# Patient Record
Sex: Female | Born: 1995 | Race: White | Hispanic: No | Marital: Single | Smoking: Never smoker
Health system: Southern US, Community
[De-identification: ages and names within clinical notes are randomized; demographics above are authoritative.]

## PROBLEM LIST (undated history)

## (undated) DIAGNOSIS — E119 Type 2 diabetes mellitus without complications: Secondary | ICD-10-CM

---
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
## 2018-04-27 IMAGING — CT CT ABD-PELV W/ CM
2 of 4 series · 16 of 46 positions shown, 18 images · IV contrast (APPLIED)
Comparison: None.

CLINICAL DATA: 20 y/o F; 2 weeks of lower abdominal pain right
greater than left with nausea and diarrhea. Leukocytosis.

EXAM:
CT ABDOMEN AND PELVIS WITH CONTRAST
TECHNIQUE: Multidetector CT imaging of the abdomen and pelvis was performed
using the standard protocol following bolus administration of
intravenous contrast.
CONTRAST:  100mL NHMH6I-SWW IOPAMIDOL (NHMH6I-SWW) INJECTION 61%

[Series 2: axial st · axial · 0.71mm/px · z∈[-463,-13]mm · 13 of 98 slices shown, 15 images]
[im 4/98  soft-tissue]
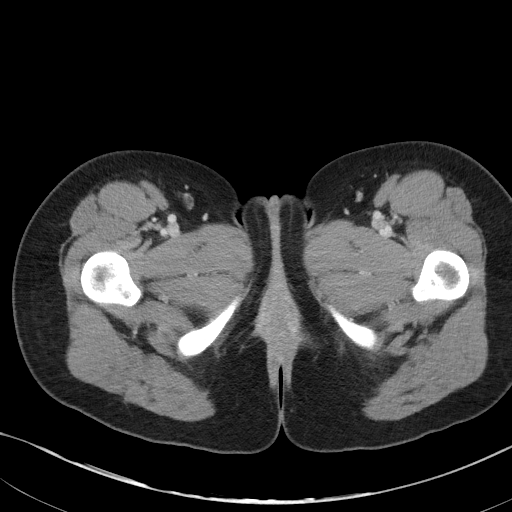
[im 4/98  bone]
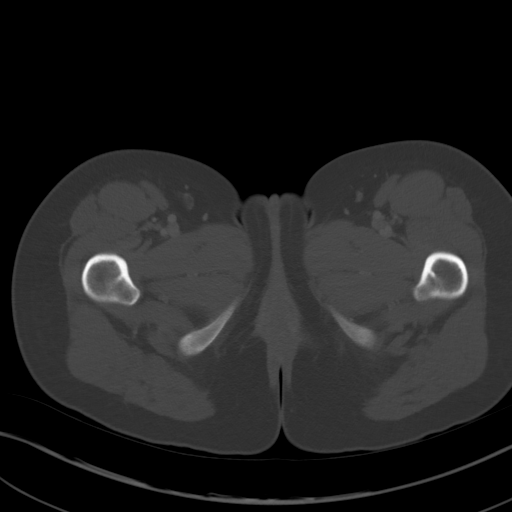
[im 12/98  soft-tissue]
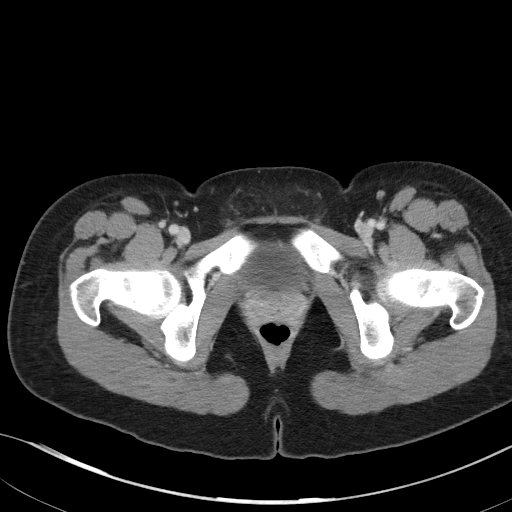
[im 20/98  soft-tissue]
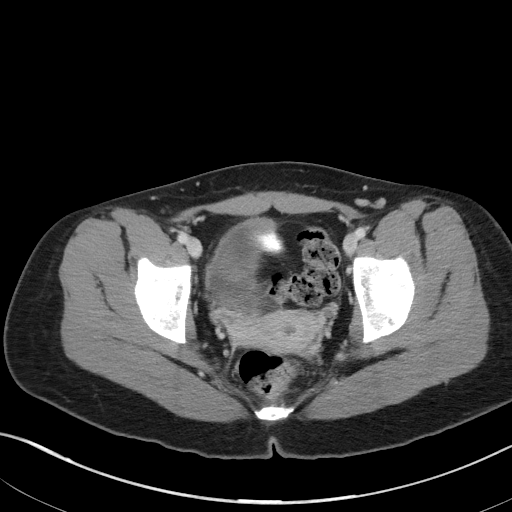
[im 28/98  soft-tissue]
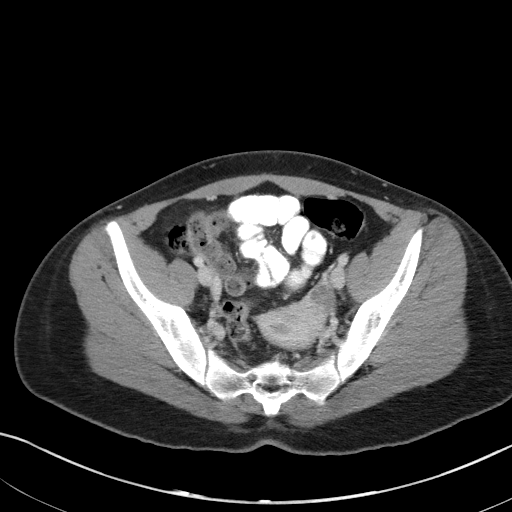
[im 35/98  soft-tissue]
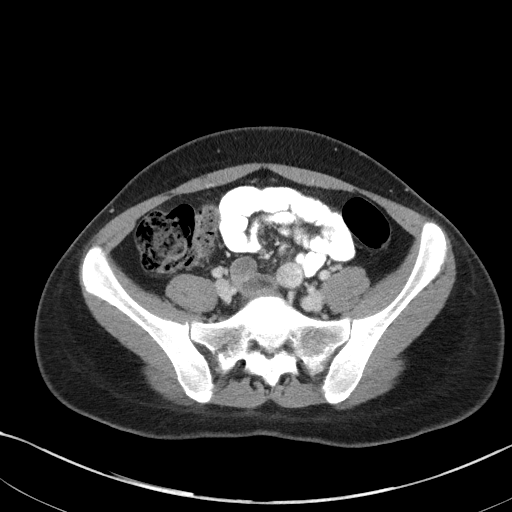
[im 43/98  soft-tissue]
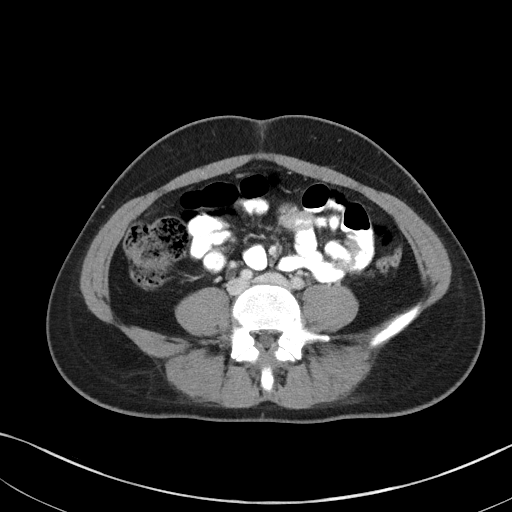
[im 51/98  soft-tissue]
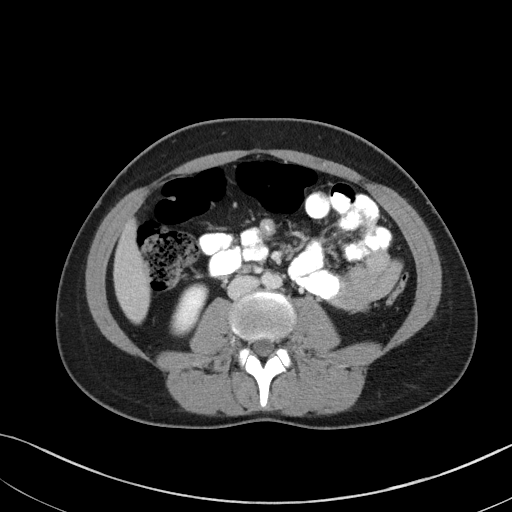
[im 55/98  soft-tissue]
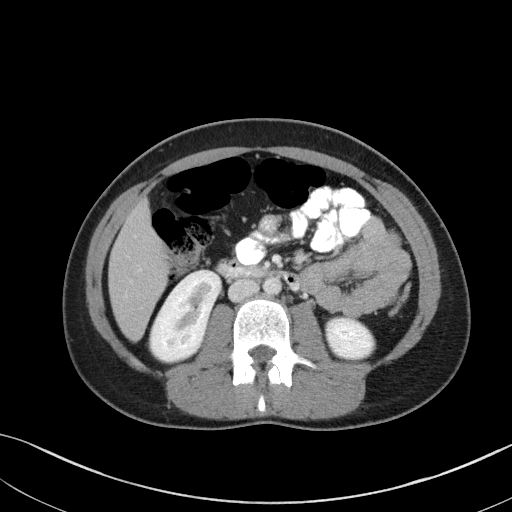
[im 63/98  soft-tissue]
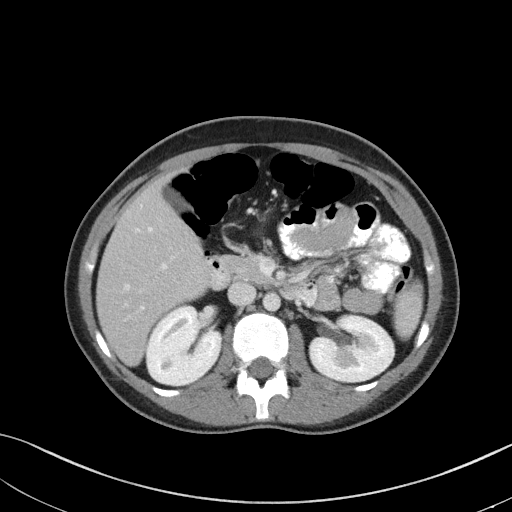
[im 63/98  bone]
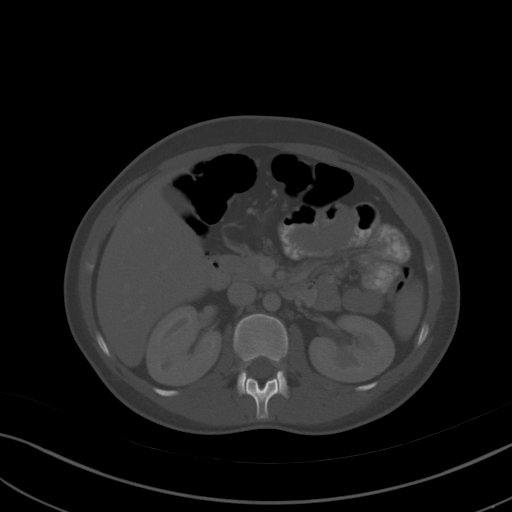
[im 70/98  soft-tissue]
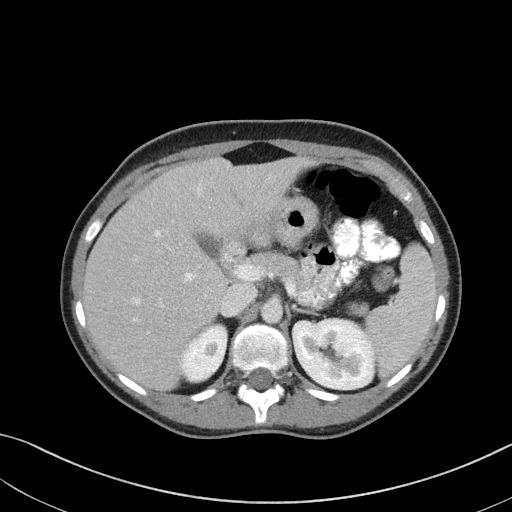
[im 78/98  soft-tissue]
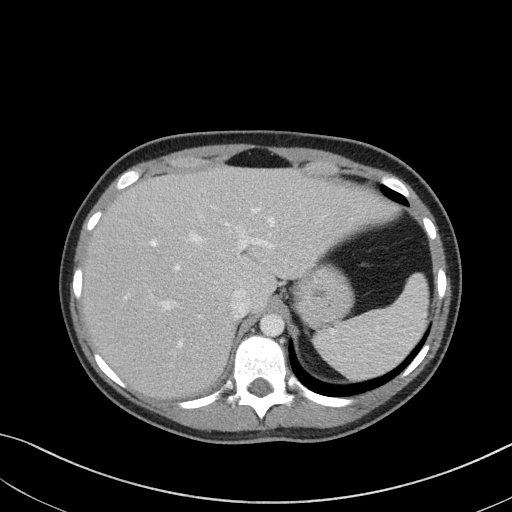
[im 86/98  soft-tissue]
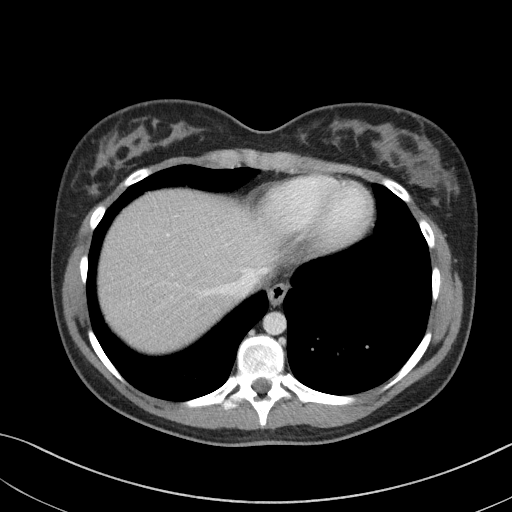
[im 94/98  soft-tissue]
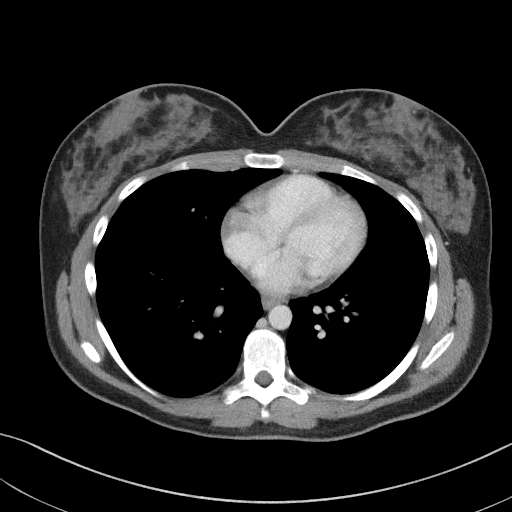

[Series 5: coronal st · coronal · 0.68mm/px · 3 of 70 slices shown]
[im 24/70  soft-tissue]
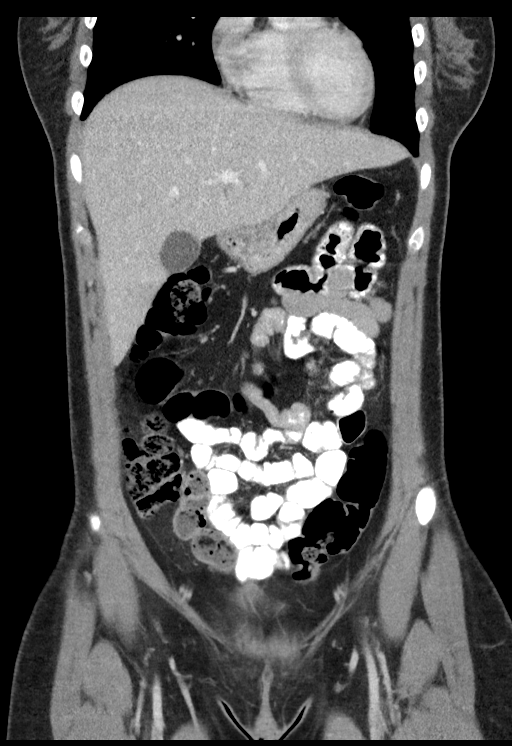
[im 31/70  soft-tissue]
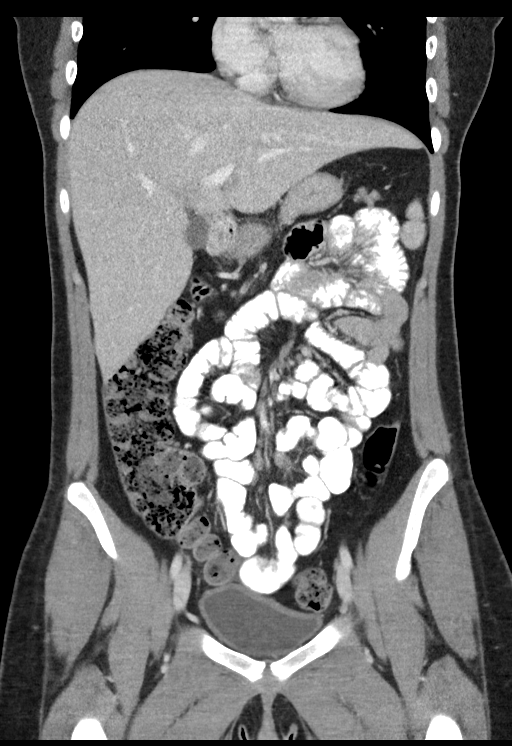
[im 39/70  soft-tissue]
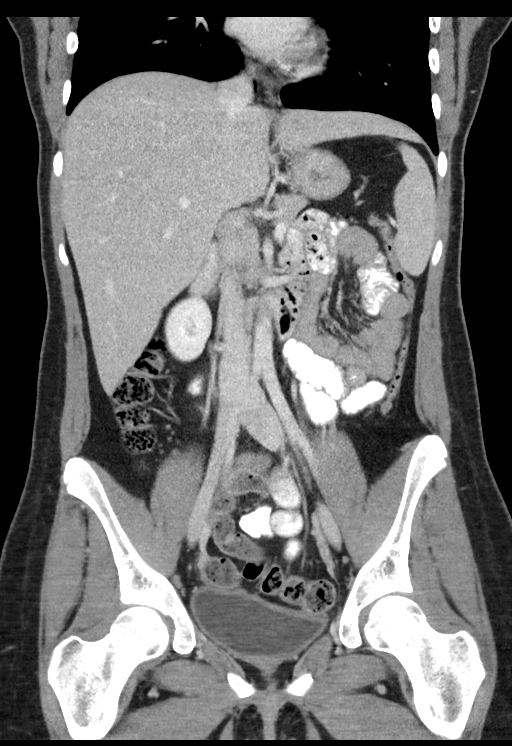

[16 of 46 positions shown; findings below may reference images not displayed]

FINDINGS: Lower chest: No acute abnormality.

Hepatobiliary: No focal liver abnormality is seen. No gallstones,
gallbladder wall thickening, or biliary dilatation.

Pancreas: Unremarkable. No pancreatic ductal dilatation or
surrounding inflammatory changes.

Spleen: Normal in size without focal abnormality.

Adrenals/Urinary Tract: Adrenal glands are unremarkable. Kidneys are
normal, without renal calculi, focal lesion, or hydronephrosis.
Bladder is unremarkable.

Stomach/Bowel: Stomach is within normal limits. Appendix appears
normal. No evidence of bowel wall thickening, distention, or
inflammatory changes.

Vascular/Lymphatic: No significant vascular findings are present. No
enlarged abdominal or pelvic lymph nodes.

Reproductive: Endometrial thickening/ fluid within the uterus. No
adnexal mass identified.

Other: No abdominal wall hernia or abnormality. No abdominopelvic
ascites.

Musculoskeletal: No acute or significant osseous findings.
IMPRESSION: 1. Prominent endometrial thickening/fluid. Correlate for
menses/proliferative phase of cycle versus pelvic infection.
2. Otherwise unremarkable CT of abdomen and including normal
appendix.

By: Camey Nikole M.D.
